# Patient Record
Sex: Female | Born: 1995
Health system: Southern US, Community
[De-identification: ages and names within clinical notes are randomized; demographics above are authoritative.]

---
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
## 2015-07-07 ENCOUNTER — Encounter (HOSPITAL_BASED_OUTPATIENT_CLINIC_OR_DEPARTMENT_OTHER): Payer: Self-pay | Admitting: *Deleted

## 2015-07-07 ENCOUNTER — Emergency Department (HOSPITAL_BASED_OUTPATIENT_CLINIC_OR_DEPARTMENT_OTHER)
Admission: EM | Admit: 2015-07-07 | Discharge: 2015-07-07 | Disposition: A | Discharge status: Home / Self Care | Payer: Medicaid Other | Attending: Emergency Medicine | Admitting: Emergency Medicine

## 2015-07-07 DIAGNOSIS — N360 Urethral fistula: Secondary | ICD-10-CM | POA: Diagnosis not present

## 2015-07-07 DIAGNOSIS — N72 Inflammatory disease of cervix uteri: Secondary | ICD-10-CM

## 2015-07-07 DIAGNOSIS — Z720 Tobacco use: Secondary | ICD-10-CM | POA: Diagnosis not present

## 2015-07-07 DIAGNOSIS — Z3202 Encounter for pregnancy test, result negative: Secondary | ICD-10-CM | POA: Diagnosis not present

## 2015-07-07 DIAGNOSIS — R300 Dysuria: Secondary | ICD-10-CM | POA: Diagnosis present

## 2015-07-07 DIAGNOSIS — B9689 Other specified bacterial agents as the cause of diseases classified elsewhere: Secondary | ICD-10-CM

## 2015-07-07 DIAGNOSIS — N760 Acute vaginitis: Secondary | ICD-10-CM

## 2015-07-07 LAB — URINALYSIS, ROUTINE W REFLEX MICROSCOPIC
Bilirubin Urine: NEGATIVE
GLUCOSE, UA: NEGATIVE mg/dL
Hgb urine dipstick: NEGATIVE
Ketones, ur: NEGATIVE mg/dL
LEUKOCYTES UA: NEGATIVE
Nitrite: NEGATIVE
PH: 7 (ref 5.0–8.0)
Protein, ur: NEGATIVE mg/dL
Specific Gravity, Urine: 1.025 (ref 1.005–1.030)
Urobilinogen, UA: 0.2 mg/dL (ref 0.0–1.0)

## 2015-07-07 LAB — WET PREP, GENITAL
TRICH WET PREP: NONE SEEN
Yeast Wet Prep HPF POC: NONE SEEN

## 2015-07-07 LAB — PREGNANCY, URINE: Preg Test, Ur: NEGATIVE

## 2015-07-07 MED ORDER — CEFTRIAXONE SODIUM 250 MG IJ SOLR
250.0000 mg | Freq: Once | INTRAMUSCULAR | Status: AC
  Administered 2015-07-07: 250 mg via INTRAMUSCULAR
  Filled 2015-07-07: qty 250

## 2015-07-07 MED ORDER — AZITHROMYCIN 250 MG PO TABS
1000.0000 mg | ORAL_TABLET | Freq: Once | ORAL | Status: AC
  Administered 2015-07-07: 1000 mg via ORAL
  Filled 2015-07-07: qty 4

## 2015-07-07 MED ORDER — LIDOCAINE HCL (PF) 1 % IJ SOLN
INTRAMUSCULAR | Status: AC
  Administered 2015-07-07: 1.9 mL
  Filled 2015-07-07: qty 5

## 2015-07-07 MED ORDER — METRONIDAZOLE 500 MG PO TABS
2000.0000 mg | ORAL_TABLET | Freq: Once | ORAL | Status: AC
  Administered 2015-07-07: 2000 mg via ORAL
  Filled 2015-07-07: qty 4

## 2015-07-07 MED ORDER — DOXYCYCLINE HYCLATE 100 MG PO CAPS: 100.0000 mg | ORAL_CAPSULE | Freq: Two times a day (BID) | ORAL | Status: AC

## 2015-07-07 NOTE — ED Provider Notes (Signed)
CSN: [id number]     Arrival date & time 07/07/15  1252 History   First MD Initiated Contact with Patient 07/07/15 1255     Chief Complaint  Patient presents with  . Dysuria     (Consider location/radiation/quality/duration/timing/severity/associated sxs/prior Treatment) HPI Comments: 19 year old female complaining of dysuria 1 week. Reports mild pelvic pressure and the urge to urinate. No aggravating or alleviating factors. Admits to associated white mixed with yellow vaginal discharge. Sexually active with one female partner and does not use protection. LMP 06/10/2015. History of Chlamydia about one year ago.  The history is provided by the patient.    History reviewed. No pertinent past medical history. History reviewed. No pertinent past surgical history. No family history on file. Social History  Substance Use Topics  . Smoking status: Current Every Day Smoker -- 0.50 packs/day  . Smokeless tobacco: None  . Alcohol Use: No   OB History    No data available     Review of Systems  Genitourinary: Positive for dysuria, urgency and vaginal discharge.  All other systems reviewed and are negative.     Allergies  Review of patient's allergies indicates no known allergies.  Home Medications   Prior to Admission medications   Medication Sig Start Date End Date Taking? Authorizing Provider  doxycycline (VIBRAMYCIN) 100 MG capsule Take 1 capsule (100 mg total) by mouth 2 (two) times daily. One po bid x 7 days 07/07/15   Nada Boozer Soren Lazarz, PA-C   BP 118/74 mmHg  Pulse 82  Temp(Src) 98.1 F (36.7 C) (Oral)  Resp 16  Ht  (1.727 m)  Wt 165 lb (74.844 kg)  BMI 25.09 kg/m2  SpO2 100%  LMP 06/10/2015 Physical Exam  Constitutional: She is oriented to person, place, and time. She appears well-developed and well-nourished. No distress.  HENT:  Head: Normocephalic and atraumatic.  Mouth/Throat: Oropharynx is clear and moist.  Eyes: Conjunctivae and EOM are normal.  Neck: Normal  range of motion. Neck supple.  Cardiovascular: Normal rate, regular rhythm and normal heart sounds.   Pulmonary/Chest: Effort normal and breath sounds normal. No respiratory distress.  Genitourinary: Uterus normal. Cervix exhibits discharge. Cervix exhibits no motion tenderness and no friability. Right adnexum displays no tenderness. Left adnexum displays no tenderness. No bleeding in the vagina. No foreign body around the vagina. Vaginal discharge found.  Musculoskeletal: Normal range of motion. She exhibits no edema.  Neurological: She is alert and oriented to person, place, and time. No sensory deficit.  Skin: Skin is warm and dry.  Psychiatric: She has a normal mood and affect. Her behavior is normal.  Nursing note and vitals reviewed.   ED Course  Procedures (including critical care time) Labs Review Labs Reviewed  WET PREP, GENITAL - Abnormal; Notable for the following:    Clue Cells Wet Prep HPF POC FEW (*)    WBC, Wet Prep HPF POC MODERATE (*)    All other components within normal limits  URINALYSIS, ROUTINE W REFLEX MICROSCOPIC (NOT AT [facility]) - Abnormal; Notable for the following:    APPearance CLOUDY (*)    All other components within normal limits  PREGNANCY, URINE  GC/CHLAMYDIA PROBE AMP ([place]) NOT AT [facility]    Imaging Review No results found. I have personally reviewed and evaluated these images and lab results as part of my medical decision-making.   EKG Interpretation None      MDM   Final diagnoses:  BV (bacterial vaginosis)  Cervicitis   Nontoxic appearing,  NAD. Vital signs stable. Abdomen soft and nontender. No CMT or adnexal tenderness concerning for PID or TOA. Treat with Rocephin, azithromycin and doxycycline. Flagyl for BV. Safe sexual practices discussed. Stable for discharge. Return precautions given. Patient states understanding of treatment care plan and is agreeable.   Kathrynn Speed, PA-C 07/07/15 1407  Rolland Porter, MD 07/09/15 [phone]

## 2015-07-07 NOTE — ED Notes (Signed)
Dysuria x 1 week. States she has frequent UTI's.

## 2015-07-07 NOTE — Discharge Instructions (Signed)
Take doxycycline twice daily. No sexual intercourse for 7 days after completing antibiotic.  Bacterial Vaginosis Bacterial vaginosis is a vaginal infection that occurs when the normal balance of bacteria in the vagina is disrupted. It results from an overgrowth of certain bacteria. This is the most common vaginal infection in women of childbearing age. Treatment is important to prevent complications, especially in pregnant women, as it can cause a premature delivery. CAUSES  Bacterial vaginosis is caused by an increase in harmful bacteria that are normally present in smaller amounts in the vagina. Several different kinds of bacteria can cause bacterial vaginosis. However, the reason that the condition develops is not fully understood. RISK FACTORS Certain activities or behaviors can put you at an increased risk of developing bacterial vaginosis, including:  Having a new sex partner or multiple sex partners.  Douching.  Using an intrauterine device (IUD) for contraception. Women do not get bacterial vaginosis from toilet seats, bedding, swimming pools, or contact with objects around them. SIGNS AND SYMPTOMS  Some women with bacterial vaginosis have no signs or symptoms. Common symptoms include:  Grey vaginal discharge.  A fishlike odor with discharge, especially after sexual intercourse.  Itching or burning of the vagina and vulva.  Burning or pain with urination. DIAGNOSIS  Your health care provider will take a medical history and examine the vagina for signs of bacterial vaginosis. A sample of vaginal fluid may be taken. Your health care provider will look at this sample under a microscope to check for bacteria and abnormal cells. A vaginal pH test may also be done.  TREATMENT  Bacterial vaginosis may be treated with antibiotic medicines. These may be given in the form of a pill or a vaginal cream. A second round of antibiotics may be prescribed if the condition comes back after  treatment.  HOME CARE INSTRUCTIONS   Only take over-the-counter or prescription medicines as directed by your health care provider.  If antibiotic medicine was prescribed, take it as directed. Make sure you finish it even if you start to feel better.  Do not have sex until treatment is completed.  Tell all sexual partners that you have a vaginal infection. They should see their health care provider and be treated if they have problems, such as a mild rash or itching.  Practice safe sex by using condoms and only having one sex partner. SEEK MEDICAL CARE IF:   Your symptoms are not improving after 3 days of treatment.  You have increased discharge or pain.  You have a fever. MAKE SURE YOU:   Understand these instructions.  Will watch your condition.  Will get help right away if you are not doing well or get worse. FOR MORE INFORMATION  Centers for Disease Control and Prevention, Division of STD Prevention: SolutionApps[employer].za American Sexual Health Association (ASHA): www.ashastd.org  Document Released: 10/28/2005 Document Revised: 08/18/2013 Document Reviewed: 06/09/2013 [facility] Patient Information 2015 [place], [place]. This information is not intended to replace advice given to you by your health care provider. Make sure you discuss any questions you have with your health care provider.  Cervicitis Cervicitis is a soreness and swelling (inflammation) of the cervix. Your cervix is located at the bottom of your uterus. It opens up to the vagina. CAUSES   Sexually transmitted infections (STIs).   Allergic reaction.   Medicines or birth control devices that are put in the vagina.   Injury to the cervix.   Bacterial infections.  RISK FACTORS You are at greater risk if  you:  Have unprotected sexual intercourse.  Have sexual intercourse with many partners.  Began sexual intercourse at an early age.  Have a history of STIs. SYMPTOMS  There may be no symptoms. If  symptoms occur, they may include:   Gray, white, yellow, or bad-smelling vaginal discharge.   Pain or itching of the area outside the vagina.   Painful sexual intercourse.   Lower abdominal or lower back pain, especially during intercourse.   Frequent urination.   Abnormal vaginal bleeding between periods, after sexual intercourse, or after menopause.   Pressure or a heavy feeling in the pelvis.  DIAGNOSIS  Diagnosis is made after a pelvic exam. Other tests may include:   Examination of any discharge under a microscope (wet prep).   A Pap test.  TREATMENT  Treatment will depend on the cause of cervicitis. If it is caused by an STI, both you and your partner will need to be treated. Antibiotic medicines will be given.  HOME CARE INSTRUCTIONS   Do not have sexual intercourse until your health care provider says it is okay.   Do not have sexual intercourse until your partner has been treated, if your cervicitis is caused by an STI.   Take your antibiotics as directed. Finish them even if you start to feel better.  SEEK MEDICAL CARE IF:  Your symptoms come back.   You have a fever.  MAKE SURE YOU:   Understand these instructions.  Will watch your condition.  Will get help right away if you are not doing well or get worse. Document Released: 10/28/2005 Document Revised: 11/02/2013 Document Reviewed: 04/21/2013 [facility] Patient Information 2015 [place], [place]. This information is not intended to replace advice given to you by your health care provider. Make sure you discuss any questions you have with your health care provider.

## 2015-07-10 LAB — GC/CHLAMYDIA PROBE AMP (~~LOC~~) NOT AT ARMC
Chlamydia: NEGATIVE
Neisseria Gonorrhea: NEGATIVE

## 2015-12-22 ENCOUNTER — Encounter (HOSPITAL_BASED_OUTPATIENT_CLINIC_OR_DEPARTMENT_OTHER): Payer: Self-pay | Admitting: *Deleted

## 2015-12-22 ENCOUNTER — Emergency Department (HOSPITAL_BASED_OUTPATIENT_CLINIC_OR_DEPARTMENT_OTHER)
Admission: EM | Admit: 2015-12-22 | Discharge: 2015-12-22 | Disposition: A | Discharge status: Home / Self Care | Payer: Medicaid Other | Attending: Emergency Medicine | Admitting: Emergency Medicine

## 2015-12-22 ENCOUNTER — Emergency Department (HOSPITAL_BASED_OUTPATIENT_CLINIC_OR_DEPARTMENT_OTHER): Payer: Medicaid Other

## 2015-12-22 DIAGNOSIS — N760 Acute vaginitis: Secondary | ICD-10-CM | POA: Diagnosis not present

## 2015-12-22 DIAGNOSIS — R102 Pelvic and perineal pain: Secondary | ICD-10-CM | POA: Diagnosis present

## 2015-12-22 DIAGNOSIS — B9689 Other specified bacterial agents as the cause of diseases classified elsewhere: Secondary | ICD-10-CM

## 2015-12-22 DIAGNOSIS — R112 Nausea with vomiting, unspecified: Secondary | ICD-10-CM | POA: Diagnosis not present

## 2015-12-22 DIAGNOSIS — Z3202 Encounter for pregnancy test, result negative: Secondary | ICD-10-CM | POA: Insufficient documentation

## 2015-12-22 DIAGNOSIS — Z87891 Personal history of nicotine dependence: Secondary | ICD-10-CM | POA: Insufficient documentation

## 2015-12-22 LAB — WET PREP, GENITAL
SPERM: NONE SEEN
Trich, Wet Prep: NONE SEEN
YEAST WET PREP: NONE SEEN

## 2015-12-22 LAB — CBC WITH DIFFERENTIAL/PLATELET
Basophils Absolute: 0 10*3/uL (ref 0.0–0.1)
Basophils Relative: 0 %
EOS ABS: 0.1 10*3/uL (ref 0.0–0.7)
EOS PCT: 2 %
HCT: 40.7 % (ref 36.0–46.0)
Hemoglobin: 13.8 g/dL (ref 12.0–15.0)
LYMPHS ABS: 2.9 10*3/uL (ref 0.7–4.0)
Lymphocytes Relative: 39 %
MCH: 32.1 pg (ref 26.0–34.0)
MCHC: 33.9 g/dL (ref 30.0–36.0)
MCV: 94.7 fL (ref 78.0–100.0)
Monocytes Absolute: 0.7 10*3/uL (ref 0.1–1.0)
Monocytes Relative: 9 %
Neutro Abs: 3.7 10*3/uL (ref 1.7–7.7)
Neutrophils Relative %: 50 %
PLATELETS: 245 10*3/uL (ref 150–400)
RBC: 4.3 MIL/uL (ref 3.87–5.11)
RDW: 12.3 % (ref 11.5–15.5)
WBC: 7.4 10*3/uL (ref 4.0–10.5)

## 2015-12-22 LAB — URINALYSIS, ROUTINE W REFLEX MICROSCOPIC
BILIRUBIN URINE: NEGATIVE
Glucose, UA: NEGATIVE mg/dL
HGB URINE DIPSTICK: NEGATIVE
KETONES UR: NEGATIVE mg/dL
NITRITE: NEGATIVE
PH: 6.5 (ref 5.0–8.0)
Protein, ur: NEGATIVE mg/dL
Specific Gravity, Urine: 1.01 (ref 1.005–1.030)

## 2015-12-22 LAB — URINE MICROSCOPIC-ADD ON

## 2015-12-22 LAB — BASIC METABOLIC PANEL
Anion gap: 9 (ref 5–15)
BUN: 8 mg/dL (ref 6–20)
CALCIUM: 9.2 mg/dL (ref 8.9–10.3)
CO2: 25 mmol/L (ref 22–32)
CREATININE: 0.74 mg/dL (ref 0.44–1.00)
Chloride: 107 mmol/L (ref 101–111)
GFR calc Af Amer: >60 mL/min (ref >60)
Glucose, Bld: 93 mg/dL (ref 65–99)
POTASSIUM: 3.4 mmol/L — AB (ref 3.5–5.1)
SODIUM: 141 mmol/L (ref 135–145)

## 2015-12-22 LAB — PREGNANCY, URINE: Preg Test, Ur: NEGATIVE

## 2015-12-22 MED ORDER — AZITHROMYCIN 250 MG PO TABS
1000.0000 mg | ORAL_TABLET | Freq: Once | ORAL | Status: AC
  Administered 2015-12-22: 1000 mg via ORAL
  Filled 2015-12-22: qty 4

## 2015-12-22 MED ORDER — CEFTRIAXONE SODIUM 250 MG IJ SOLR
250.0000 mg | Freq: Once | INTRAMUSCULAR | Status: AC
  Administered 2015-12-22: 250 mg via INTRAMUSCULAR
  Filled 2015-12-22: qty 250

## 2015-12-22 MED ORDER — METRONIDAZOLE 500 MG PO TABS: 500.0000 mg | ORAL_TABLET | Freq: Three times a day (TID) | ORAL | Status: AC

## 2015-12-22 MED ORDER — LIDOCAINE HCL (PF) 1 % IJ SOLN
INTRAMUSCULAR | Status: AC
  Administered 2015-12-22: 0.9 mL
  Filled 2015-12-22: qty 5

## 2015-12-22 MED ORDER — AZITHROMYCIN 250 MG PO TABS
ORAL_TABLET | ORAL | Status: AC
  Filled 2015-12-22: qty 1

## 2015-12-22 MED FILL — metroNIDAZOLE 500 MG TABS: 500 | 7 days supply | Qty: 21 | Fill #0

## 2015-12-22 NOTE — ED Provider Notes (Signed)
CSN: [id number]     Arrival date & time 12/22/15  1143 History   First MD Initiated Contact with Patient 12/22/15 1517     Chief Complaint  Patient presents with  . Abdominal Pain     (Consider location/radiation/quality/duration/timing/severity/associated sxs/prior Treatment) HPI Comments: Patient complains of lower pelvic pain ongoing for the past 4 days. The pain comes and goes. It is worse with bending over and worse at night. She one episode of vomiting. No diarrhea. No urinary symptoms. Has had some vaginal discharge and normal menstrual cycles. Denies fever. Denies chest pain or shortness of breath. Good appetite. Does not think that she is pregnant. Has had this pain in the past and told to "inflamed pelvis".  Patient is a 20 y.o. female presenting with abdominal pain. The history is provided by the patient.  Abdominal Pain Associated symptoms: nausea, vaginal discharge and vomiting   Associated symptoms: no constipation, no cough, no diarrhea, no dysuria, no fatigue, no fever, no hematuria, no shortness of breath and no vaginal bleeding     History reviewed. No pertinent past medical history. History reviewed. No pertinent past surgical history. No family history on file. Social History  Substance Use Topics  . Smoking status: Former Smoker -- 0.00 packs/day  . Smokeless tobacco: None  . Alcohol Use: No   OB History    No data available     Review of Systems  Constitutional: Negative for fever, activity change, appetite change and fatigue.  Respiratory: Negative for cough, chest tightness and shortness of breath.   Gastrointestinal: Positive for nausea, vomiting and abdominal pain. Negative for diarrhea and constipation.  Genitourinary: Positive for vaginal discharge. Negative for dysuria, hematuria and vaginal bleeding.  Musculoskeletal: Negative for myalgias and arthralgias.  Skin: Negative for rash.  Neurological: Negative for dizziness, weakness and headaches.  A  complete 10 system review of systems was obtained and all systems are negative except as noted in the HPI and PMH.      Allergies  Review of patient's allergies indicates no known allergies.  Home Medications   Prior to Admission medications   Medication Sig Start Date End Date Taking? Authorizing Provider  doxycycline (VIBRAMYCIN) 100 MG capsule Take 1 capsule (100 mg total) by mouth 2 (two) times daily. One po bid x 7 days 07/07/15   Kathrynn Speed, PA-C  metroNIDAZOLE (FLAGYL) 500 MG tablet Take 1 tablet (500 mg total) by mouth 3 (three) times daily. 12/22/15   Glynn Octave, MD   BP 115/74 mmHg  Pulse 70  Temp(Src) 97.6 F (36.4 C) (Oral)  Resp 16  Ht  (1.727 m)  Wt 165 lb (74.844 kg)  BMI 25.09 kg/m2  SpO2 100%  LMP 12/13/2015 Physical Exam  Constitutional: She is oriented to person, place, and time. She appears well-developed and well-nourished. No distress.  Well appearing  HENT:  Head: Normocephalic and atraumatic.  Mouth/Throat: Oropharynx is clear and moist. No oropharyngeal exudate.  Eyes: Conjunctivae and EOM are normal. Pupils are equal, round, and reactive to light.  Neck: Normal range of motion. Neck supple.  No meningismus.  Cardiovascular: Normal rate, regular rhythm, normal heart sounds and intact distal pulses.   No murmur heard. Pulmonary/Chest: Effort normal and breath sounds normal. No respiratory distress.  Abdominal: Soft. There is tenderness. There is no rebound and no guarding.  Mild suprapubic and left lower quadrant tenderness, no guarding or rebound, no right lower quadrant tenderness  Genitourinary: Vaginal discharge found.  Chaperone present, normal external  genitalia, white vaginal discharge, no CMT. Left adnexal tenderness.  Musculoskeletal: Normal range of motion. She exhibits no edema or tenderness.  Neurological: She is alert and oriented to person, place, and time. No cranial nerve deficit. She exhibits normal muscle tone. Coordination  normal.  No ataxia on finger to nose bilaterally. No pronator drift. 5/5 strength throughout. CN 2-12 intact.Equal grip strength. Sensation intact.   Skin: Skin is warm.  Psychiatric: She has a normal mood and affect. Her behavior is normal.  Nursing note and vitals reviewed.   ED Course  Procedures (including critical care time) Labs Review Labs Reviewed  WET PREP, GENITAL - Abnormal; Notable for the following:    Clue Cells Wet Prep HPF POC PRESENT (*)    WBC, Wet Prep HPF POC FEW (*)    All other components within normal limits  URINALYSIS, ROUTINE W REFLEX MICROSCOPIC (NOT AT [facility]) - Abnormal; Notable for the following:    APPearance CLOUDY (*)    Leukocytes, UA SMALL (*)    All other components within normal limits  URINE MICROSCOPIC-ADD ON - Abnormal; Notable for the following:    Squamous Epithelial / LPF 6-30 (*)    Bacteria, UA FEW (*)    All other components within normal limits  BASIC METABOLIC PANEL - Abnormal; Notable for the following:    Potassium 3.4 (*)    All other components within normal limits  PREGNANCY, URINE  CBC WITH DIFFERENTIAL/PLATELET  GC/CHLAMYDIA PROBE AMP ([place]) NOT AT [facility]    Imaging Review US Transvaginal Non-ob  12/22/2015  CLINICAL DATA:  Left adnexal pain for 4 days intermittently, worse with movement. Nausea and vomiting intermittently as well. EXAM: TRANSABDOMINAL AND TRANSVAGINAL ULTRASOUND OF PELVIS DOPPLER ULTRASOUND OF OVARIES TECHNIQUE: Both transabdominal and transvaginal ultrasound examinations of the pelvis were performed. Transabdominal technique was performed for global imaging of the pelvis including uterus, ovaries, adnexal regions, and pelvic cul-de-sac. It was necessary to proceed with endovaginal exam following the transabdominal exam to visualize the uterus, endometrium, and ovaries. Color and duplex Doppler ultrasound was utilized to evaluate blood flow to the ovaries. COMPARISON:  None. FINDINGS: Uterus Measurements: 7.2  x 3.0 x 3.9 cm. No fibroids or other mass visualized. Endometrium Thickness: 7 mm.  No focal abnormality visualized. Right ovary Measurements: 3.5 x 2.2 x 2.9 cm. Normal appearance/no adnexal mass. Left ovary Measurements: 3.6 x 2.0 x 2.6 cm. Normal appearance/no adnexal mass. Pulsed Doppler evaluation of both ovaries demonstrates normal low-resistance arterial and venous waveforms. Other findings No abnormal free fluid. IMPRESSION: Unremarkable pelvic ultrasound. Electronically Signed   By: Sebastian Ache M.D.   On: 12/22/2015 16:06   US Pelvis Complete  12/22/2015  CLINICAL DATA:  Left adnexal pain for 4 days intermittently, worse with movement. Nausea and vomiting intermittently as well. EXAM: TRANSABDOMINAL AND TRANSVAGINAL ULTRASOUND OF PELVIS DOPPLER ULTRASOUND OF OVARIES TECHNIQUE: Both transabdominal and transvaginal ultrasound examinations of the pelvis were performed. Transabdominal technique was performed for global imaging of the pelvis including uterus, ovaries, adnexal regions, and pelvic cul-de-sac. It was necessary to proceed with endovaginal exam following the transabdominal exam to visualize the uterus, endometrium, and ovaries. Color and duplex Doppler ultrasound was utilized to evaluate blood flow to the ovaries. COMPARISON:  None. FINDINGS: Uterus Measurements: 7.2 x 3.0 x 3.9 cm. No fibroids or other mass visualized. Endometrium Thickness: 7 mm.  No focal abnormality visualized. Right ovary Measurements: 3.5 x 2.2 x 2.9 cm. Normal appearance/no adnexal mass. Left ovary Measurements: 3.6 x 2.0  x 2.6 cm. Normal appearance/no adnexal mass. Pulsed Doppler evaluation of both ovaries demonstrates normal low-resistance arterial and venous waveforms. Other findings No abnormal free fluid. IMPRESSION: Unremarkable pelvic ultrasound. Electronically Signed   By: Sebastian Ache M.D.   On: 12/22/2015 16:06   [place] Art/ven Flow Abd Pelv Doppler  12/22/2015  CLINICAL DATA:  Left adnexal pain for 4 days  intermittently, worse with movement. Nausea and vomiting intermittently as well. EXAM: TRANSABDOMINAL AND TRANSVAGINAL ULTRASOUND OF PELVIS DOPPLER ULTRASOUND OF OVARIES TECHNIQUE: Both transabdominal and transvaginal ultrasound examinations of the pelvis were performed. Transabdominal technique was performed for global imaging of the pelvis including uterus, ovaries, adnexal regions, and pelvic cul-de-sac. It was necessary to proceed with endovaginal exam following the transabdominal exam to visualize the uterus, endometrium, and ovaries. Color and duplex Doppler ultrasound was utilized to evaluate blood flow to the ovaries. COMPARISON:  None. FINDINGS: Uterus Measurements: 7.2 x 3.0 x 3.9 cm. No fibroids or other mass visualized. Endometrium Thickness: 7 mm.  No focal abnormality visualized. Right ovary Measurements: 3.5 x 2.2 x 2.9 cm. Normal appearance/no adnexal mass. Left ovary Measurements: 3.6 x 2.0 x 2.6 cm. Normal appearance/no adnexal mass. Pulsed Doppler evaluation of both ovaries demonstrates normal low-resistance arterial and venous waveforms. Other findings No abnormal free fluid. IMPRESSION: Unremarkable pelvic ultrasound. Electronically Signed   By: Sebastian Ache M.D.   On: 12/22/2015 16:06   I have personally reviewed and evaluated these images and lab results as part of my medical decision-making.   EKG Interpretation None      MDM   Final diagnoses:  Adnexal pain  Bacterial vaginosis   Pelvic pain with vaginal discharge. No right lower quadrant pain. Well-appearing.  Pregnancy negative. UA not consistent with infection.  Pelvic exam is benign does have left adnexal tenderness. Ultrasound shows normal ovaries and uterus no evidence of torsion or TOA.  We'll refer to gynecology for further evaluation of pelvic pain. Patient treated empirically for GC and chlamydia. Treatment of bacterial vaginosis. Return precautions discussed.   Glynn Octave, MD 12/22/15 [phone]

## 2015-12-22 NOTE — ED Notes (Signed)
Abdominal pain. States it feels like a strain in her lower abdomen. Vomiting.

## 2015-12-22 NOTE — Discharge Instructions (Signed)

## 2015-12-22 NOTE — ED Notes (Signed)
Pt placed on auto vitals Q30.  

## 2015-12-25 LAB — GC/CHLAMYDIA PROBE AMP (~~LOC~~) NOT AT ARMC
Chlamydia: NEGATIVE
Neisseria Gonorrhea: NEGATIVE

## 2017-06-28 IMAGING — US US ART/VEN ABD/PELV/SCROTUM DOPPLER LTD
1 series · 13 of 25 positions shown · non-contrast
Comparison: None.

CLINICAL DATA: Left adnexal pain for 4 days intermittently, worse
with movement. Nausea and vomiting intermittently as well.

EXAM:
TRANSABDOMINAL AND TRANSVAGINAL ULTRASOUND OF PELVIS
DOPPLER ULTRASOUND OF OVARIES
TECHNIQUE: Both transabdominal and transvaginal ultrasound examinations of the
pelvis were performed. Transabdominal technique was performed for
global imaging of the pelvis including uterus, ovaries, adnexal
regions, and pelvic cul-de-sac.
It was necessary to proceed with endovaginal exam following the
transabdominal exam to visualize the uterus, endometrium, and
ovaries. Color and duplex Doppler ultrasound was utilized to
evaluate blood flow to the ovaries.

[Series 1: us art/ven abd/pelv/scrotum doppler ltd · 0.21mm/px · 13 of 79 slices shown]
[im 1/79]
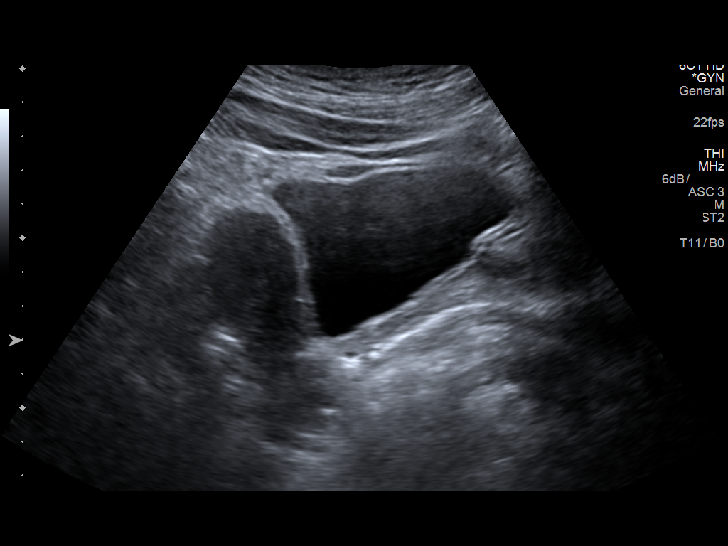
[im 7/79]
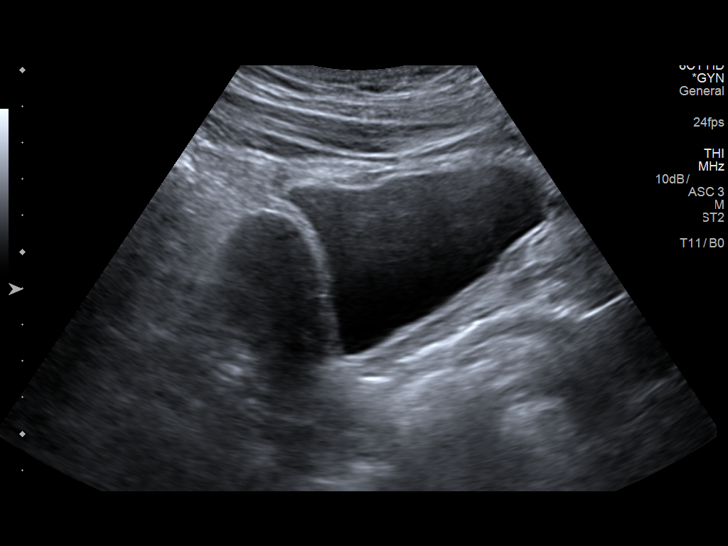
[im 14/79]
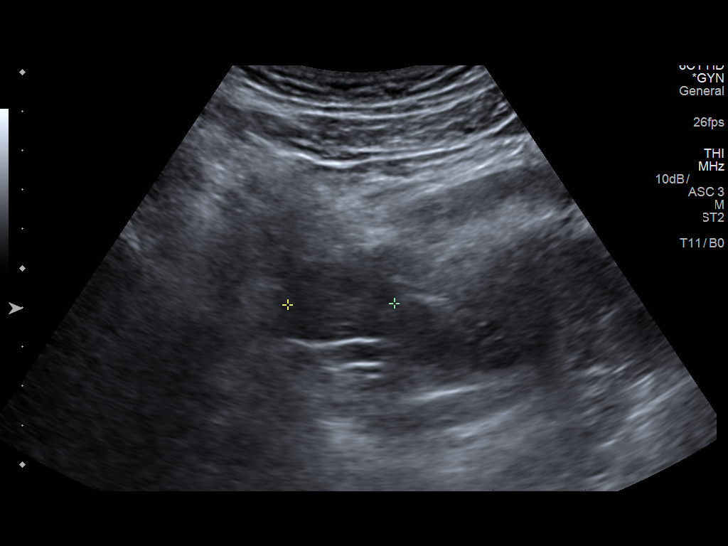
[im 20/79]
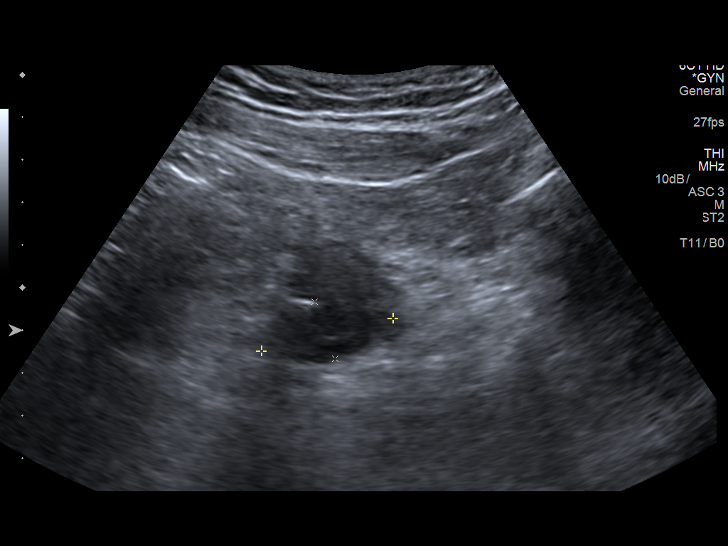
[im 27/79]
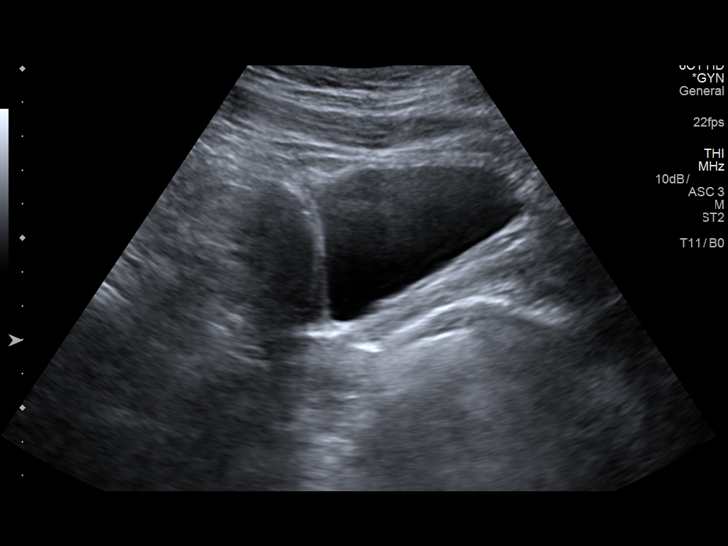
[im 33/79]
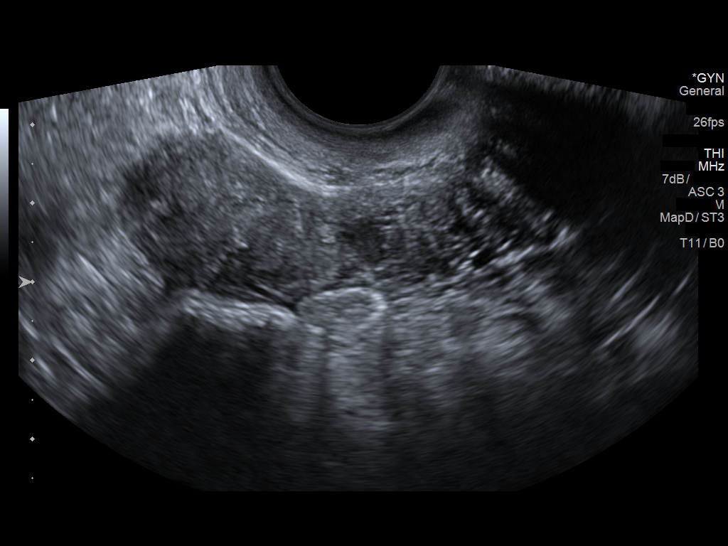
[im 40/79]
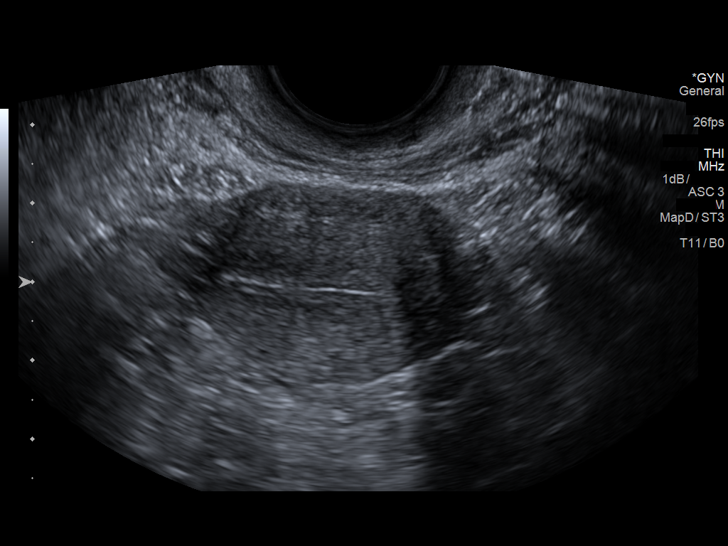
[im 46/79]
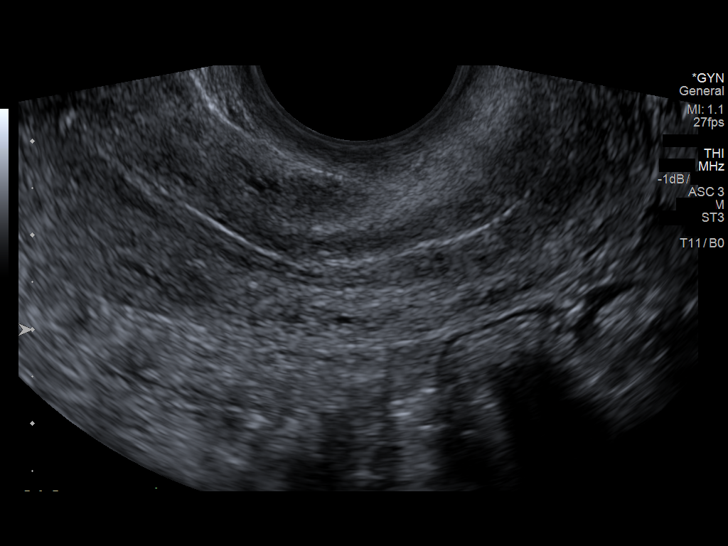
[im 53/79]
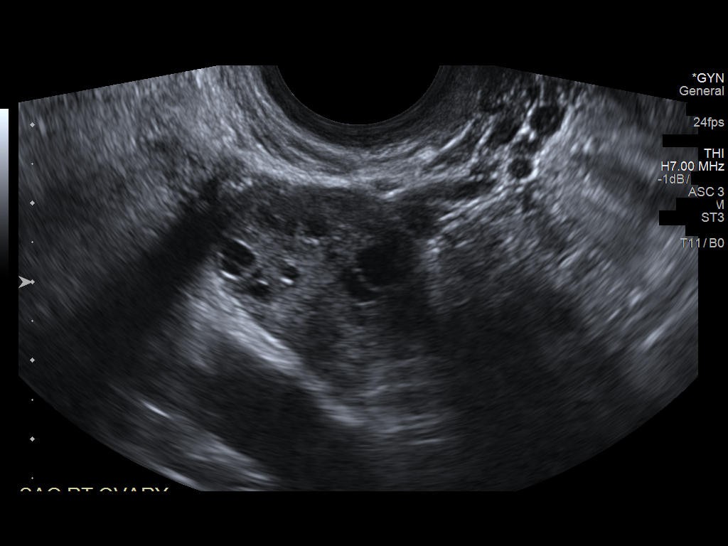
[im 59/79]
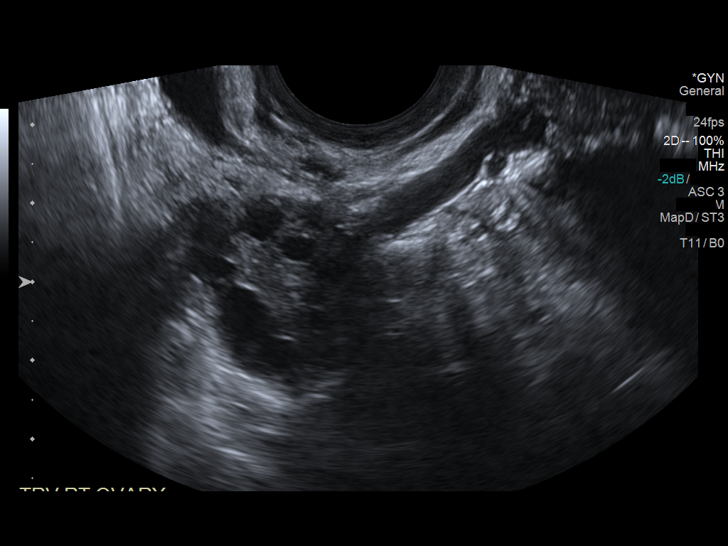
[im 66/79]
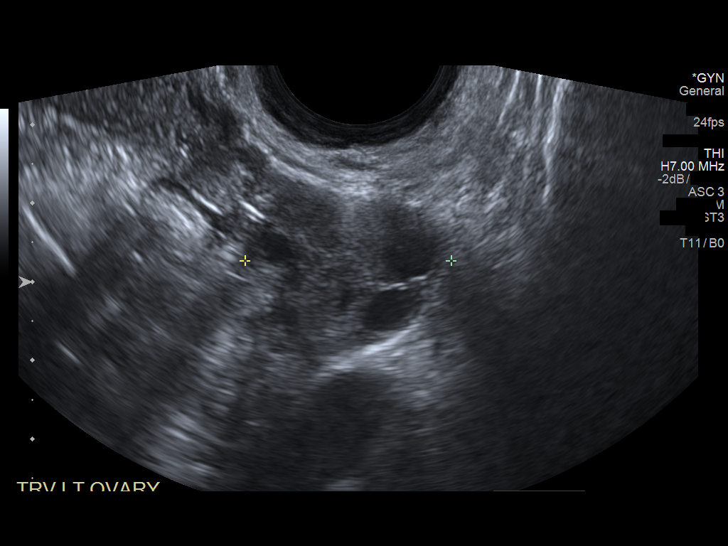
[im 72/79]
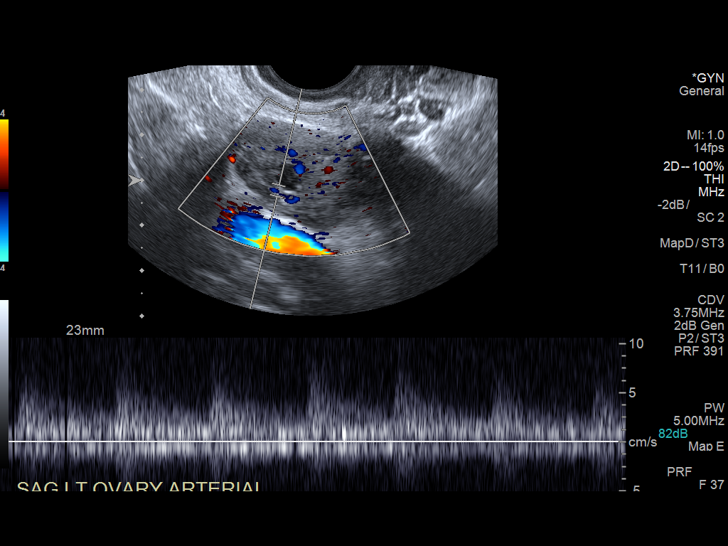
[im 79/79]
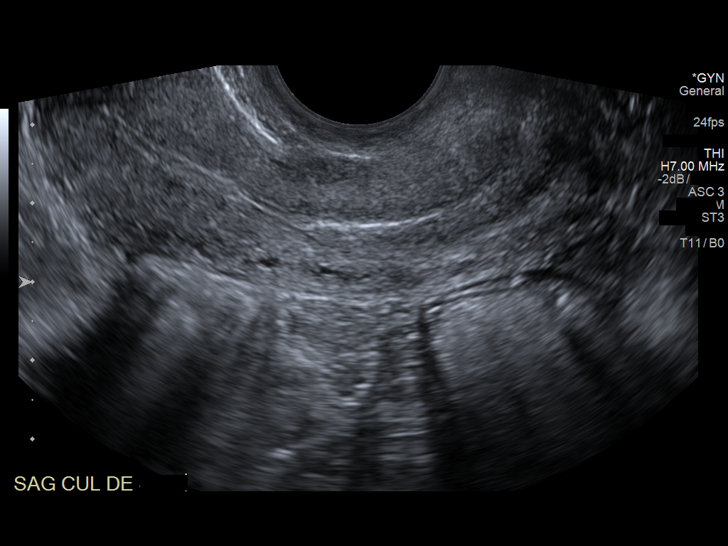

[13 of 25 positions shown; findings below may reference images not displayed]

FINDINGS: Uterus

Measurements: 7.2 x 3.0 x 3.9 cm. No fibroids or other mass
visualized.

Endometrium

Thickness: 7 mm.  No focal abnormality visualized.

Right ovary

Measurements: 3.5 x 2.2 x 2.9 cm. Normal appearance/no adnexal mass.

Left ovary

Measurements: 3.6 x 2.0 x 2.6 cm. Normal appearance/no adnexal mass.

Pulsed Doppler evaluation of both ovaries demonstrates normal
low-resistance arterial and venous waveforms.

Other findings

No abnormal free fluid.
IMPRESSION: Unremarkable pelvic ultrasound.
